# Patient Record
Sex: Male | Born: 1975 | Hispanic: No | Marital: Single | Smoking: Former smoker
Health system: Southern US, Community
[De-identification: ages and names within clinical notes are randomized; demographics above are authoritative.]

## PROBLEM LIST (undated history)

## (undated) DIAGNOSIS — R7303 Prediabetes: Secondary | ICD-10-CM

## (undated) DIAGNOSIS — I10 Essential (primary) hypertension: Secondary | ICD-10-CM

## (undated) HISTORY — PX: NO PAST SURGERIES: SHX2092

---

## 2014-06-01 ENCOUNTER — Ambulatory Visit (INDEPENDENT_AMBULATORY_CARE_PROVIDER_SITE_OTHER): Payer: 59 | Admitting: Family Medicine

## 2014-06-01 ENCOUNTER — Other Ambulatory Visit: Payer: Self-pay | Admitting: Family Medicine

## 2014-06-01 VITALS — BP 112/76 | HR 80 | Temp 99.5°F | Resp 16 | Ht 69.0 in | Wt 296.0 lb

## 2014-06-01 DIAGNOSIS — Z789 Other specified health status: Secondary | ICD-10-CM

## 2014-06-01 DIAGNOSIS — R7989 Other specified abnormal findings of blood chemistry: Secondary | ICD-10-CM

## 2014-06-01 DIAGNOSIS — E291 Testicular hypofunction: Secondary | ICD-10-CM | POA: Diagnosis not present

## 2014-06-01 DIAGNOSIS — G5602 Carpal tunnel syndrome, left upper limb: Secondary | ICD-10-CM | POA: Diagnosis not present

## 2014-06-01 DIAGNOSIS — G5601 Carpal tunnel syndrome, right upper limb: Secondary | ICD-10-CM

## 2014-06-01 DIAGNOSIS — G5603 Carpal tunnel syndrome, bilateral upper limbs: Secondary | ICD-10-CM

## 2014-06-01 DIAGNOSIS — Z3169 Encounter for other general counseling and advice on procreation: Secondary | ICD-10-CM

## 2014-06-01 NOTE — Patient Instructions (Addendum)
If all of your blood tests come back normal, I will refer you to a urologist so that you can have your sperm count checked.  If your blood tests come back abnormal, I will likely request that you come back in some morning for recheck. Recommend eating small frequent meals throughout the day, do not eat after 6 pm.  Dinner should be your smallest meal.  Try to choose low calorie snacks or things with high protein (yogurt, carrot and hummus). Make sure less thatn 25% of your meal is carbohydrate (breast, potato, corn, rice) and at least 50% is vegetables (potatoes and corn don't count)  Infertility WHAT IS INFERTILITY?  Infertility is usually defined as not being able to get pregnant after trying for one year of regular sexual intercourse without the use of contraceptives. Or not being able to carry a pregnancy to term and have a baby. The infertility rate in the [place] is around 10%. Pregnancy is the result of a chain of events. A woman must release an egg from one of her ovaries (ovulation). The egg must be fertilized by the male sperm. Then it travels through a fallopian tube into the uterus (womb), where it attaches to the wall of the uterus and grows. A man must have enough sperm, and the sperm must join with (fertilize) the egg along the way, at the proper time. The fertilized egg must then become attached to the inside of the uterus. While this may seem simple, many things can happen to prevent pregnancy from occurring.  WHOSE PROBLEM IS IT?  About 20% of infertility cases are due to problems with the man (male factors) and 65% are due to problems with the woman (male factors). Other cases are due to a combination of male and male factors or to unknown causes.  WHAT CAUSES INFERTILITY IN MEN?  Infertility in men is often caused by problems with making enough normal sperm or getting the sperm to reach the egg. Problems with sperm may exist from birth or develop later in life, due to illness  or injury. Some men produce no sperm, or produce too few sperm (oligospermia). Other problems include:  Sexual dysfunction.  Hormonal or endocrine problems.  Age. Male fertility decreases with age, but not at as young an age as male fertility.  Infection.  Congenital problems. Birth defect, such as absence of the tubes that carry the sperm (vas deferens).  Genetic/chromosomal problems.  Antisperm antibody problems.  Retrograde ejaculation (sperm go into the bladder).  Varicoceles, spematoceles, or tumors of the testicles.  Lifestyle can influence the number and quality of a man's sperm.  Alcohol and drugs can temporarily reduce sperm quality.  Environmental toxins, including pesticides and lead, may cause some cases of infertility in men. WHAT CAUSES INFERTILITY IN WOMEN?   Problems with ovulation account for most infertility in women. Without ovulation, eggs are not available to be fertilized.  Signs of problems with ovulation include irregular menstrual periods or no periods at all.  Simple lifestyle factors, including stress, diet, or athletic training, can affect a woman's hormonal balance.  Age. Fertility begins to decrease in women in the early 89s and is worse after age 27.  Much less often, a hormonal imbalance from a serious medical problem, such as a pituitary gland tumor, thyroid or other chronic medical disease, can cause ovulation problems.  Pelvic infections.  Polycystic ovary syndrome (increase in male hormones, unable to ovulate).  Alcohol or illegal drugs.  Environmental toxins, radiation, pesticides,  and certain chemicals.  Aging is an important factor in male infertility.  The ability of a woman's ovaries to produce eggs declines with age, especially after age 75. About one third of couples where the woman is over 9 will have problems with fertility.  By the time she reaches menopause when her monthly periods stop for good, a woman can no  longer produce eggs or become pregnant.  Other problems can also lead to infertility in women. If the fallopian tubes are blocked at one or both ends, the egg cannot travel through the tubes into the uterus. Scar tissue (adhesions) in the pelvis may cause blocked tubes. This may result from pelvic inflammatory disease, endometriosis, or surgery for an ectopic pregnancy (fertilized egg implanted outside the uterus) or any pelvic or abdominal surgery causing adhesions.  Fibroid tumors or polyps of the uterus.  Congenital (birth defect) abnormalities of the uterus.  Infection of the cervix (cervicitis).  Cervical stenosis (narrowing).  Abnormal cervical mucus.  Polycystic ovary syndrome.  Having sexual intercourse too often (every other day or 4 to 5 times a week).  Obesity.  Anorexia.  Poor nutrition.  Over exercising, with loss of body fat.  DES. Your mother received diethylstilbesterol hormone when pregnant with you. HOW IS INFERTILITY TESTED?  If you have been trying to have a baby without success, you may want to seek medical help. You should not wait for one year of trying before seeing a health care provider if:  You are over 35.  You have reason to believe that there may be a fertility problem. A medical evaluation may determine the reasons for a couple's infertility. Usually this process begins with:  Physical exams.  Medical histories of both partners.  Sexual histories of both partners. If there is no obvious problem, like improperly timed intercourse or absence of ovulation, tests may be needed.   For a man, testing usually begins with tests of his semen to look at:  The number of sperm.  The shape of sperm.  Movement of his sperm.  Taking a complete medical and surgical history.  Physical examination.  Check for infection of the male reproductive organs. Sometimes hormone tests are done.   For a woman, the first step in testing is to find out if she  is ovulating each month. There are several ways to do this. For example, she can keep track of changes in her morning body temperature and in the texture of her cervical mucus. Another tool is a home ovulation test kit, which can be bought at drug or grocery stores.  Checks of ovulation can also be done in the doctor's office, using blood tests for hormone levels or ultrasound tests of the ovaries. If the woman is ovulating, more tests will need to be done. Some common male tests include:  Hysterosalpingogram: An x-ray of the fallopian tubes and uterus after they are injected with dye. It shows if the tubes are open and shows the shape of the uterus.  Laparoscopy: An exam of the tubes and other male organs for disease. A lighted tube called a laparoscope is used to see inside the abdomen.  Endometrial biopsy: Sample of uterus tissue taken on the first day of the menstrual period, to see if the tissue indicates you are ovulating.  Transvaginal ultrasound: Examines the male organs.  Hysteroscopy: Uses a lighted tube to examine the cervix and inside the uterus, to see if there are any abnormalities inside the uterus. TREATMENT  Depending on  the test results, different treatments can be suggested. The type of treatment depends on the cause. 85 to 90% of infertility cases are treated with drugs or surgery.   Various fertility drugs may be used for women with ovulation problems. It is important to talk with your caregiver about the drug to be used. You should understand the drug's benefits and side effects. Depending on the type of fertility drug and the dosage of the drug used, multiple births (twins or multiples) can occur in some women.  If needed, surgery can be done to repair damage to a woman's ovaries, fallopian tubes, cervix, or uterus.  Surgery or medical treatment for endometriosis or polycystic ovary syndrome. Sometimes a man has an infertility problem that can be corrected with  medicine or by surgery.  Intrauterine insemination (IUI) of sperm, timed with ovulation.  Change in lifestyle, if that is the cause (lose weight, increase exercise, and stop smoking, drinking excessively, or taking illegal drugs).  Other types of surgery:  Removing growths inside and on the uterus.  Removing scar tissue from inside of the uterus.  Fixing blocked tubes.  Removing scar tissue in the pelvis and around the male organs. WHAT IS ASSISTED REPRODUCTIVE TECHNOLOGY (ART)?  Assisted reproductive technology (ART) is another form of special methods used to help infertile couples. ART involves handling both the woman's eggs and the man's sperm. Success rates vary and depend on many factors. ART can be expensive and time-consuming. But ART has made it possible for many couples to have children that otherwise would not have been conceived. Some methods are listed below:  In vitro fertilization (IVF). IVF is often used when a woman's fallopian tubes are blocked or when a man has low sperm counts. A drug is used to stimulate the ovaries to produce multiple eggs. Once mature, the eggs are removed and placed in a culture dish with the man's sperm for fertilization. After about 40 hours, the eggs are examined to see if they have become fertilized by the sperm and are dividing into cells. These fertilized eggs (embryos) are then placed in the woman's uterus. This bypasses the fallopian tubes.  Gamete intrafallopian transfer (GIFT) is similar to IVF, but used when the woman has at least one normal fallopian tube. Three to five eggs are placed in the fallopian tube, along with the man's sperm, for fertilization inside the woman's body.  Zygote intrafallopian transfer (ZIFT), also called tubal embryo transfer, combines IVF and GIFT. The eggs retrieved from the woman's ovaries are fertilized in the lab and placed in the fallopian tubes rather than in the uterus.  ART procedures sometimes involve the  use of donor eggs (eggs from another woman) or previously frozen embryos. Donor eggs may be used if a woman has impaired ovaries or has a genetic disease that could be passed on to her baby.  When performing ART, you are at higher risk for resulting in multiple pregnancies, twins, triplets or more.  Intracytoplasma sperm injection is a procedure that injects a single sperm into the egg to fertilize it.  Embryo transplant is a procedure that starts after growing an embryo in a special media (chemical solution) developed to keep the embryo alive for 2 to 5 days, and then transplanting it into the uterus. In cases where a cause cannot be found and pregnancy does not occur, adoption may be a consideration. Document Released: 12/29/2002 Document Revised: 03/20/2011 Document Reviewed: 11/24/2008 [facility] Patient Information 2015 [place], [place]. This information is not intended  to replace advice given to you by your health care provider. Make sure you discuss any questions you have with your health care provider.     Why follow it? Research shows. . Those who follow the Mediterranean diet have a reduced risk of heart disease  . The diet is associated with a reduced incidence of Parkinson's and Alzheimer's diseases . People following the diet may have longer life expectancies and lower rates of chronic diseases  . The Dietary Guidelines for Americans recommends the Mediterranean diet as an eating plan to promote health and prevent disease  What Is the Mediterranean Diet?  . Healthy eating plan based on typical foods and recipes of Mediterranean-style cooking . The diet is primarily a plant based diet; these foods should make up a majority of meals   Starches - Plant based foods should make up a majority of meals - They are an important sources of vitamins, minerals, energy, antioxidants, and fiber - Choose whole grains, foods high in fiber and minimally processed items  - Typical grain sources  include wheat, oats, barley, corn, brown rice, bulgar, farro, millet, polenta, couscous  - Various types of beans include chickpeas, lentils, fava beans, black beans, white beans   Fruits  Veggies - Large quantities of antioxidant rich fruits & veggies; 6 or more servings  - Vegetables can be eaten raw or lightly drizzled with oil and cooked  - Vegetables common to the traditional Mediterranean Diet include: artichokes, arugula, beets, broccoli, brussel sprouts, cabbage, carrots, celery, collard greens, cucumbers, eggplant, kale, leeks, lemons, lettuce, mushrooms, okra, onions, peas, peppers, potatoes, pumpkin, radishes, rutabaga, shallots, spinach, sweet potatoes, turnips, zucchini - Fruits common to the Mediterranean Diet include: apples, apricots, avocados, cherries, clementines, dates, figs, grapefruits, grapes, melons, nectarines, oranges, peaches, pears, pomegranates, strawberries, tangerines  Fats - Replace butter and margarine with healthy oils, such as olive oil, canola oil, and tahini  - Limit nuts to no more than a handful a day  - Nuts include walnuts, almonds, pecans, pistachios, pine nuts  - Limit or avoid candied, honey roasted or heavily salted nuts - Olives are central to the [employer] - can be eaten whole or used in a variety of dishes   Meats Protein - Limiting red meat: no more than a few times a month - When eating red meat: choose lean cuts and keep the portion to the size of deck of cards - Eggs: approx. 0 to 4 times a week  - Fish and lean poultry: at least 2 a week  - Healthy protein sources include, chicken, [place], lean beef, lamb - Increase intake of seafood such as tuna, salmon, trout, mackerel, shrimp, scallops - Avoid or limit high fat processed meats such as sausage and bacon  Dairy - Include moderate amounts of low fat dairy products  - Focus on healthy dairy such as fat free yogurt, skim milk, low or reduced fat cheese - Limit dairy products higher in  fat such as whole or 2% milk, cheese, ice cream  Alcohol - Moderate amounts of red wine is ok  - No more than 5 oz daily for women (all ages) and men older than age 61  - No more than 10 oz of wine daily for men younger than 74  Other - Limit sweets and other desserts  - Use herbs and spices instead of salt to flavor foods  - Herbs and spices common to the traditional Mediterranean Diet include: basil, bay leaves, chives, cloves, cumin, fennel,  garlic, lavender, marjoram, mint, oregano, parsley, pepper, rosemary, sage, savory, sumac, tarragon, thyme   It's not just a diet, it's a lifestyle:  . The Mediterranean diet includes lifestyle factors typical of those in the region  . Foods, drinks and meals are best eaten with others and savored . Daily physical activity is important for overall good health . This could be strenuous exercise like running and aerobics . This could also be more leisurely activities such as walking, housework, yard-work, or taking the stairs . Moderation is the key; a balanced and healthy diet accommodates most foods and drinks . Consider portion sizes and frequency of consumption of certain foods   Meal Ideas & Options:  . Breakfast:  o Whole wheat toast or whole wheat English muffins with peanut butter & hard boiled egg o Steel cut oats topped with apples & cinnamon and skim milk  o Fresh fruit: banana, strawberries, melon, berries, peaches  o Smoothies: strawberries, bananas, greek yogurt, peanut butter o Low fat greek yogurt with blueberries and granola  o Egg white omelet with spinach and mushrooms o Breakfast couscous: whole wheat couscous, apricots, skim milk, cranberries  . Sandwiches:  o Hummus and grilled vegetables (peppers, zucchini, squash) on whole wheat bread   o Grilled chicken on whole wheat pita with lettuce, tomatoes, cucumbers or tzatziki  o Tuna salad on whole wheat bread: tuna salad made with greek yogurt, olives, red peppers, capers, green  onions o Garlic rosemary lamb pita: lamb sauted with garlic, rosemary, salt & pepper; add lettuce, cucumber, greek yogurt to pita - flavor with lemon juice and black pepper  . Seafood:  o Mediterranean grilled salmon, seasoned with garlic, basil, parsley, lemon juice and black pepper o Shrimp, lemon, and spinach whole-grain pasta salad made with low fat greek yogurt  o Seared scallops with lemon orzo  o Seared tuna steaks seasoned salt, pepper, coriander topped with tomato mixture of olives, tomatoes, olive oil, minced garlic, parsley, green onions and cappers  . Meats:  o Herbed greek chicken salad with kalamata olives, cucumber, feta  o Red bell peppers stuffed with spinach, bulgur, lean ground beef (or lentils) & topped with feta   o Kebabs: skewers of chicken, tomatoes, onions, zucchini, squash  o [place] burgers: made with red onions, mint, dill, lemon juice, feta cheese topped with roasted red peppers . Vegetarian o Cucumber salad: cucumbers, artichoke hearts, celery, red onion, feta cheese, tossed in olive oil & lemon juice  o Hummus and whole grain pita points with a greek salad (lettuce, tomato, feta, olives, cucumbers, red onion) o Lentil soup with celery, carrots made with vegetable broth, garlic, salt and pepper  o Tabouli salad: parsley, bulgur, mint, scallions, cucumbers, tomato, radishes, lemon juice, olive oil, salt and pepper.

## 2014-06-01 NOTE — Progress Notes (Addendum)
Subjective:    Patient ID: Chad Valencia, male    DOB: 19-Oct-1975, 39 y.o.   MRN: [id number] This chart was scribed for Chad MochaEva N Malesha Suliman, MD by [place] Peace, ED Scribe. The patient was seen in [facility]. The patient's care was started at 5:40 PM.  Chief Complaint  Patient presents with  . Numbness    off and on x3 weeks rt hand and sometimes in lt leg   . testosterone levels     HPI  HPI Comments: Chad Valencia is a 39 y.o. male [facility] of intermittent numbness in fingers of both hands over the past few months that has gotten progressively worse. Pt reports that numbness is at it's worst in the mornings. He states he works with his hands a lot at work but denies any activities in particular that cause pain. No complaints of weakness in hands. He further denies any neck pain, shoulder pain, or wrist pain. Pt is right-handed.  He reports that he is not taking any medication for symptoms. He adds that he is not currently taking any medications or vitamins as well. Pt reports he has a PCP, last physical was 2 years ago.  [place] He has been married for 2 years and trying to conceive without any success. He states his wife left last week to go to back to [place] and he is not sure when she is coming back, possibly in a few months. He requests complete lab evaluation as to causes of male infertility. Our discussion is limited by language carrier so I initally thought he was asking for sperm count to be done but now suspect he was just asking for check of testosterone levels.   He wants to lose weight. He is going to the gym regularly and has been trying to consume fresh homemade smoothies, lemon water, and tea. He states he does not know why he is not losing weight but upon our in office discussion, he was very reluctant to make any recommended changes. He eats a large amount of bread at night.    History reviewed. No pertinent past medical history. History  reviewed. No pertinent past surgical history. No Known Allergies   Review of Systems  Constitutional: Positive for activity change, appetite change, fatigue and unexpected weight change. Negative for fever and chills.  Respiratory: Negative for chest tightness.   Cardiovascular: Negative for chest pain.  Gastrointestinal: Negative for vomiting and abdominal pain.  Endocrine: Negative for polyuria.  Genitourinary: Negative for frequency and enuresis.  Musculoskeletal: Positive for myalgias. Negative for back pain, joint swelling, arthralgias, gait problem, neck pain and neck stiffness.  Allergic/Immunologic: Negative for immunocompromised state.  Neurological: Positive for numbness (Right and Left fingers ). Negative for weakness.        Objective:   Physical Exam  Constitutional: He is oriented to person, place, and time. He appears well-developed and well-nourished. No distress.  HENT:  Head: Normocephalic and atraumatic.  Eyes: Conjunctivae and EOM are normal.  Neck: Neck supple. No tracheal deviation present.  Cardiovascular: Normal rate.   Pulmonary/Chest: Effort normal. No respiratory distress.  Musculoskeletal: Normal range of motion.  Good ROM of shoulders bilaterally.  Good ROM of Cervical spine.  4+/5 pincher grasp.   Neurological: He is alert and oriented to person, place, and time.  Reflex Scores:      Tricep reflexes are 2+ on the right side and 2+ on the left side.  Bicep reflexes are 2+ on the right side and 2+ on the left side.      Brachioradialis reflexes are 2+ on the right side and 2+ on the left side. Postive Phalen's on right. Negative reverse Phalen's. Negative Phalen's on the left.   Negative Spalding.  Positive Tinel's bilaterally.    Skin: Skin is warm and dry.  Psychiatric: He has a normal mood and affect. His behavior is normal.  Nursing note and vitals reviewed.   Filed Vitals:   06/01/14 1709  BP: 112/76  Pulse: 80  Temp: 99.5 F (37.5  C)  TempSrc: Oral  Resp: 16  Height:  (1.753 m)  Weight: 296 lb (134.265 kg)  SpO2: 98%    5:49 PM- Treatment plan was discussed with patient who verbalizes understanding and agrees.       Assessment & Plan:   1. Carpal tunnel syndrome, bilateral - start wrists splints bilaterally o/n and on Rt during day until sxs resolve - handout given - if do not or worsening rec referral to hand surg  2. Infertility counseling - difficult conversation due to language barrier - i suspect pt may want a sperm count to check for male infertility - wife is currently back in [place] and will return in a few months - they have unsuccessfully trying to conceive for >2  yrs so will refer to urology for further eval  3. Severe obesity (BMI >= 40) - reviewed diet changes - pt eating to many carbs (bread) and eating to infrequently so suggested these changes but pt seems to frustrated to grasp and try to make changes at this point - fasting x 8 hrs - so check labs.  4.       Low testosterone - test done today came back at 210 - repeat first thing a.m. Fasting (future lab orders entered so pt can do lab only visit for this) and refer to urology - pt is a good candidate for testosterone replacement as his BP and cholesterol are great and he may see more results from all of the exercise he is doing - the lack of which has been really frustrating for him -   Orders Placed This Encounter  Procedures  . Testosterone, Free, Total, SHBG  . Comprehensive metabolic panel    Order Specific Question:  Has the patient fasted?    Answer:  Yes  . TSH  . CBC  . Lipid panel    Order Specific Question:  Has the patient fasted?    Answer:  Yes     I personally performed the services described in this documentation, which was scribed in my presence. The recorded information has been reviewed and considered, and addended by me as needed.  Norberto Sorenson, MD MPH

## 2014-06-02 LAB — LIPID PANEL
CHOL/HDL RATIO: 3.3 ratio
Cholesterol: 210 mg/dL — ABNORMAL HIGH (ref 0–200)
HDL: 63 mg/dL (ref >=40)
LDL Cholesterol: 129 mg/dL — ABNORMAL HIGH (ref 0–99)
Triglycerides: 91 mg/dL (ref <150)
VLDL: 18 mg/dL (ref 0–40)

## 2014-06-02 LAB — COMPREHENSIVE METABOLIC PANEL
ALK PHOS: 52 U/L (ref 39–117)
ALT: 17 U/L (ref 0–53)
AST: 12 U/L (ref 0–37)
Albumin: 4.5 g/dL (ref 3.5–5.2)
BUN: 8 mg/dL (ref 6–23)
CALCIUM: 9.4 mg/dL (ref 8.4–10.5)
CO2: 26 meq/L (ref 19–32)
Chloride: 102 mEq/L (ref 96–112)
Creat: 0.54 mg/dL (ref 0.50–1.35)
Glucose, Bld: 106 mg/dL — ABNORMAL HIGH (ref 70–99)
POTASSIUM: 4.1 meq/L (ref 3.5–5.3)
SODIUM: 139 meq/L (ref 135–145)
Total Bilirubin: 0.4 mg/dL (ref 0.2–1.2)
Total Protein: 7 g/dL (ref 6.0–8.3)

## 2014-06-02 LAB — CBC
HCT: 41.9 % (ref 39.0–52.0)
Hemoglobin: 14.8 g/dL (ref 13.0–17.0)
MCH: 29.2 pg (ref 26.0–34.0)
MCHC: 35.3 g/dL (ref 30.0–36.0)
MCV: 82.8 fL (ref 78.0–100.0)
MPV: 9.4 fL (ref 8.6–12.4)
PLATELETS: 374 10*3/uL (ref 150–400)
RBC: 5.06 MIL/uL (ref 4.22–5.81)
RDW: 13.2 % (ref 11.5–15.5)
WBC: 5 10*3/uL (ref 4.0–10.5)

## 2014-06-02 LAB — TSH: TSH: 0.908 u[IU]/mL (ref 0.350–4.500)

## 2014-06-03 ENCOUNTER — Encounter: Payer: Self-pay | Admitting: Family Medicine

## 2014-06-03 LAB — TESTOSTERONE, FREE, TOTAL, SHBG
Sex Hormone Binding: 34 nmol/L (ref 10–50)
TESTOSTERONE FREE: 39.3 pg/mL — AB (ref 47.0–244.0)
TESTOSTERONE: 210 ng/dL — AB (ref 300–890)
Testosterone-% Free: 1.9 % (ref 1.6–2.9)

## 2014-06-03 LAB — HEMOGLOBIN A1C
HEMOGLOBIN A1C: 5.9 % — AB (ref <5.7)
Mean Plasma Glucose: 123 mg/dL — ABNORMAL HIGH (ref <117)

## 2014-06-03 NOTE — Addendum Note (Signed)
Addended by: Norberto SorensonSHAW, EVA on: 06/03/2014 09:51 AM   Modules accepted: Orders

## 2014-06-07 ENCOUNTER — Encounter: Payer: Self-pay | Admitting: Family Medicine

## 2015-02-26 ENCOUNTER — Ambulatory Visit (INDEPENDENT_AMBULATORY_CARE_PROVIDER_SITE_OTHER): Payer: 59 | Admitting: Urgent Care

## 2015-02-26 ENCOUNTER — Ambulatory Visit (INDEPENDENT_AMBULATORY_CARE_PROVIDER_SITE_OTHER): Payer: 59

## 2015-02-26 VITALS — BP 118/76 | HR 81 | Temp 99.5°F | Resp 16 | Ht 68.75 in | Wt 298.0 lb

## 2015-02-26 DIAGNOSIS — R109 Unspecified abdominal pain: Secondary | ICD-10-CM

## 2015-02-26 DIAGNOSIS — R112 Nausea with vomiting, unspecified: Secondary | ICD-10-CM

## 2015-02-26 DIAGNOSIS — R195 Other fecal abnormalities: Secondary | ICD-10-CM

## 2015-02-26 DIAGNOSIS — R630 Anorexia: Secondary | ICD-10-CM

## 2015-02-26 DIAGNOSIS — R1084 Generalized abdominal pain: Secondary | ICD-10-CM | POA: Diagnosis not present

## 2015-02-26 LAB — POCT CBC
Granulocyte percent: 62.5 %G (ref 37–80)
HCT, POC: 41.3 % — AB (ref 43.5–53.7)
Hemoglobin: 14.5 g/dL (ref 14.1–18.1)
Lymph, poc: 2.5 (ref 0.6–3.4)
MCH, POC: 29.8 pg (ref 27–31.2)
MCHC: 35.2 g/dL (ref 31.8–35.4)
MCV: 84.5 fL (ref 80–97)
MID (cbc): 0.3 (ref 0–0.9)
MPV: 6.6 fL (ref 0–99.8)
PLATELET COUNT, POC: 362 10*3/uL (ref 142–424)
POC Granulocyte: 4.7 (ref 2–6.9)
POC LYMPH PERCENT: 32.9 %L (ref 10–50)
POC MID %: 4.6 % (ref 0–12)
RBC: 4.88 M/uL (ref 4.69–6.13)
RDW, POC: 12.6 %
WBC: 7.5 10*3/uL (ref 4.6–10.2)

## 2015-02-26 LAB — COMPREHENSIVE METABOLIC PANEL
ALBUMIN: 4.3 g/dL (ref 3.6–5.1)
ALK PHOS: 58 U/L (ref 40–115)
ALT: 15 U/L (ref 9–46)
AST: 11 U/L (ref 10–40)
BUN: 6 mg/dL — ABNORMAL LOW (ref 7–25)
CHLORIDE: 102 mmol/L (ref 98–110)
CO2: 29 mmol/L (ref 20–31)
CREATININE: 0.53 mg/dL — AB (ref 0.60–1.35)
Calcium: 9.5 mg/dL (ref 8.6–10.3)
Glucose, Bld: 107 mg/dL — ABNORMAL HIGH (ref 65–99)
POTASSIUM: 4.6 mmol/L (ref 3.5–5.3)
Sodium: 140 mmol/L (ref 135–146)
TOTAL PROTEIN: 6.9 g/dL (ref 6.1–8.1)
Total Bilirubin: 0.5 mg/dL (ref 0.2–1.2)

## 2015-02-26 LAB — POCT GLYCOSYLATED HEMOGLOBIN (HGB A1C): Hemoglobin A1C: 5.6

## 2015-02-26 MED ORDER — ONDANSETRON 4 MG PO TBDP
8.0000 mg | ORAL_TABLET | Freq: Once | ORAL | Status: AC
  Administered 2015-02-26: 8 mg via ORAL

## 2015-02-26 MED ORDER — ONDANSETRON 8 MG PO TBDP: 8.0000 mg | ORAL_TABLET | Freq: Three times a day (TID) | ORAL | Status: DC | PRN

## 2015-02-26 NOTE — Addendum Note (Signed)
Addended by: Marcellus Scott on: 02/26/2015 06:43 PM   Modules accepted: Orders

## 2015-02-26 NOTE — Progress Notes (Signed)
MRN: [id number] DOB: [birth year]  Subjective:   Chad Valencia is a 40 y.o. male presenting for chief complaint of Abdominal Pain  Reports ~3 day history of upper belly pain, nausea and vomiting x3 (yesterday), loose stools (3 BM yesterday), decreased appetite, chills. Patient has not been able to hold anything down. Has not tried any medications to help his symptoms. He has also had intermittent chest pain/epigastric pain. Chest pain is mid-sternal, achy, non-radiating, worse when he tries to eat. Denies fever, bloody stools, hematemesis. Patient smokes ~5 cigarettes, smoking for ~15 years. Denies alcohol use.  Chad Valencia currently has no medications in their medication list. Also has No Known Allergies.  Chad Valencia  has no past medical history on file. Also  has no past surgical history on file.  Objective:   Vitals: BP 118/76 mmHg  Pulse 81  Temp(Src) 99.5 F (37.5 C) (Oral)  Resp 16  Ht 5' 8.75" (1.746 m)  Wt 298 lb (135.172 kg)  BMI 44.34 kg/m2  SpO2 96%  Physical Exam  Constitutional: He is oriented to person, place, and time. He appears well-developed and well-nourished.  Body habitus is morbidly obese.  HENT:  Mouth/Throat: Oropharynx is clear and moist.  Eyes: Right eye exhibits no discharge. Left eye exhibits no discharge. No scleral icterus.  Neck: Normal range of motion. Neck supple.  Cardiovascular: Normal rate, regular rhythm and intact distal pulses.  Exam reveals no gallop and no friction rub.   No murmur heard. Pulmonary/Chest: No respiratory distress. He has no wheezes. He has no rales.  Abdominal: Soft. Bowel sounds are normal. He exhibits no distension and no mass. There is tenderness (right-sided, worst in RLQ).  Neurological: He is alert and oriented to person, place, and time.  Skin: Skin is warm and dry.   Results for orders placed or performed in visit on 02/26/15 (from the past 24 hour(s))  POCT CBC     Status: Abnormal   Collection Time: 02/26/15   4:57 PM  Result Value Ref Range   WBC 7.5 4.6 - 10.2 K/uL   Lymph, poc 2.5 0.6 - 3.4   POC LYMPH PERCENT 32.9 10 - 50 %L   MID (cbc) 0.3 0 - 0.9   POC MID % 4.6 0 - 12 %M   POC Granulocyte 4.7 2 - 6.9   Granulocyte percent 62.5 37 - 80 %G   RBC 4.88 4.69 - 6.13 M/uL   Hemoglobin 14.5 14.1 - 18.1 g/dL   HCT, POC 29.5 (A) 62.1 - 53.7 %   MCV 84.5 80 - 97 fL   MCH, POC 29.8 27 - 31.2 pg   MCHC 35.2 31.8 - 35.4 g/dL   RDW, POC 30.8 %   Platelet Count, POC 362 142 - 424 K/uL   MPV 6.6 0 - 99.8 fL  POCT glycosylated hemoglobin (Hb A1C)     Status: None   Collection Time: 02/26/15  5:03 PM  Result Value Ref Range   Hemoglobin A1C 5.6    Dg Abd 1 View  02/26/2015  CLINICAL DATA:  Right abdominal pain, nausea and vomiting. EXAM: ABDOMEN - 1 VIEW COMPARISON:  None. FINDINGS: No dilated small bowel loops. Minimal physiologic stool in the colon. No evidence of pneumatosis, pneumoperitoneum or pathologic soft tissue calcifications. Visualized osseous structures appear intact. IMPRESSION: Nonobstructive bowel gas pattern. Electronically Signed   By: Delbert Phenix M.D.   On: 02/26/2015 17:06   Assessment and Plan :   1. Generalized abdominal pain 2. Nausea and  vomiting, intractability of vomiting not specified, unspecified vomiting type 3. Loose stools 4. Decreased appetite 5. Right sided abdominal pain - Patient was unable to provide urine sample which is suspect is due to dehydration. He had significant improvement with 1L of fluids and  Zofran. Patient would like to manage this as viral gastroenteritis, rtc if no improvement in 5 days.  Chad Bamberg, PA-C [facility] [phone] 02/26/2015 4:29 PM

## 2015-02-26 NOTE — Patient Instructions (Addendum)
Because you received an x-ray today, you will receive an invoice from [facility]. Please contact [facility] at [phone] with questions or concerns regarding your invoice. Our billing staff will not be able to assist you with those questions.    Viral Gastroenteritis Viral gastroenteritis is also known as stomach flu. This condition affects the stomach and intestinal tract. It can cause sudden diarrhea and vomiting. The illness typically lasts 3 to 8 days. Most people develop an immune response that eventually gets rid of the virus. While this natural response develops, the virus can make you quite ill. CAUSES  Many different viruses can cause gastroenteritis, such as rotavirus or noroviruses. You can catch one of these viruses by consuming contaminated food or water. You may also catch a virus by sharing utensils or other personal items with an infected person or by touching a contaminated surface. SYMPTOMS  The most common symptoms are diarrhea and vomiting. These problems can cause a severe loss of body fluids (dehydration) and a body salt (electrolyte) imbalance. Other symptoms may include:  Fever.  Headache.  Fatigue.  Abdominal pain. DIAGNOSIS  Your caregiver can usually diagnose viral gastroenteritis based on your symptoms and a physical exam. A stool sample may also be taken to test for the presence of viruses or other infections. TREATMENT  This illness typically goes away on its own. Treatments are aimed at rehydration. The most serious cases of viral gastroenteritis involve vomiting so severely that you are not able to keep fluids down. In these cases, fluids must be given through an intravenous line (IV). HOME CARE INSTRUCTIONS   Drink enough fluids to keep your urine clear or pale yellow. Drink small amounts of fluids frequently and increase the amounts as tolerated.  Ask your caregiver for specific rehydration instructions.  Avoid:  Foods high in  sugar.  Alcohol.  Carbonated drinks.  Tobacco.  Juice.  Caffeine drinks.  Extremely hot or cold fluids.  Fatty, greasy foods.  Too much intake of anything at one time.  Dairy products until 24 to 48 hours after diarrhea stops.  You may consume probiotics. Probiotics are active cultures of beneficial bacteria. They may lessen the amount and number of diarrheal stools in adults. Probiotics can be found in yogurt with active cultures and in supplements.  Wash your hands well to avoid spreading the virus.  Only take over-the-counter or prescription medicines for pain, discomfort, or fever as directed by your caregiver. Do not give aspirin to children. Antidiarrheal medicines are not recommended.  Ask your caregiver if you should continue to take your regular prescribed and over-the-counter medicines.  Keep all follow-up appointments as directed by your caregiver. SEEK IMMEDIATE MEDICAL CARE IF:   You are unable to keep fluids down.  You do not urinate at least once every 6 to 8 hours.  You develop shortness of breath.  You notice blood in your stool or vomit. This may look like coffee grounds.  You have abdominal pain that increases or is concentrated in one small area (localized).  You have persistent vomiting or diarrhea.  You have a fever.  The patient is a child younger than 3 months, and he or she has a fever.  The patient is a child older than 3 months, and he or she has a fever and persistent symptoms.  The patient is a child older than 3 months, and he or she has a fever and symptoms suddenly get worse.  The patient is a baby, and he or  she has no tears when crying. MAKE SURE YOU:   Understand these instructions.  Will watch your condition.  Will get help right away if you are not doing well or get worse.   This information is not intended to replace advice given to you by your health care provider. Make sure you discuss any questions you have with your  health care provider.   Document Released: 12/26/2004 Document Revised: 03/20/2011 Document Reviewed: 10/12/2010 Elsevier Interactive Patient Education [employer]

## 2015-03-01 ENCOUNTER — Encounter: Payer: Self-pay | Admitting: Urgent Care

## 2017-02-02 IMAGING — CR DG ABDOMEN 1V
2 series · 2 of 2 positions shown · non-contrast
Comparison: None.

CLINICAL DATA: Right abdominal pain, nausea and vomiting.

EXAM:
ABDOMEN - 1 VIEW

[AP (1 of 2)]
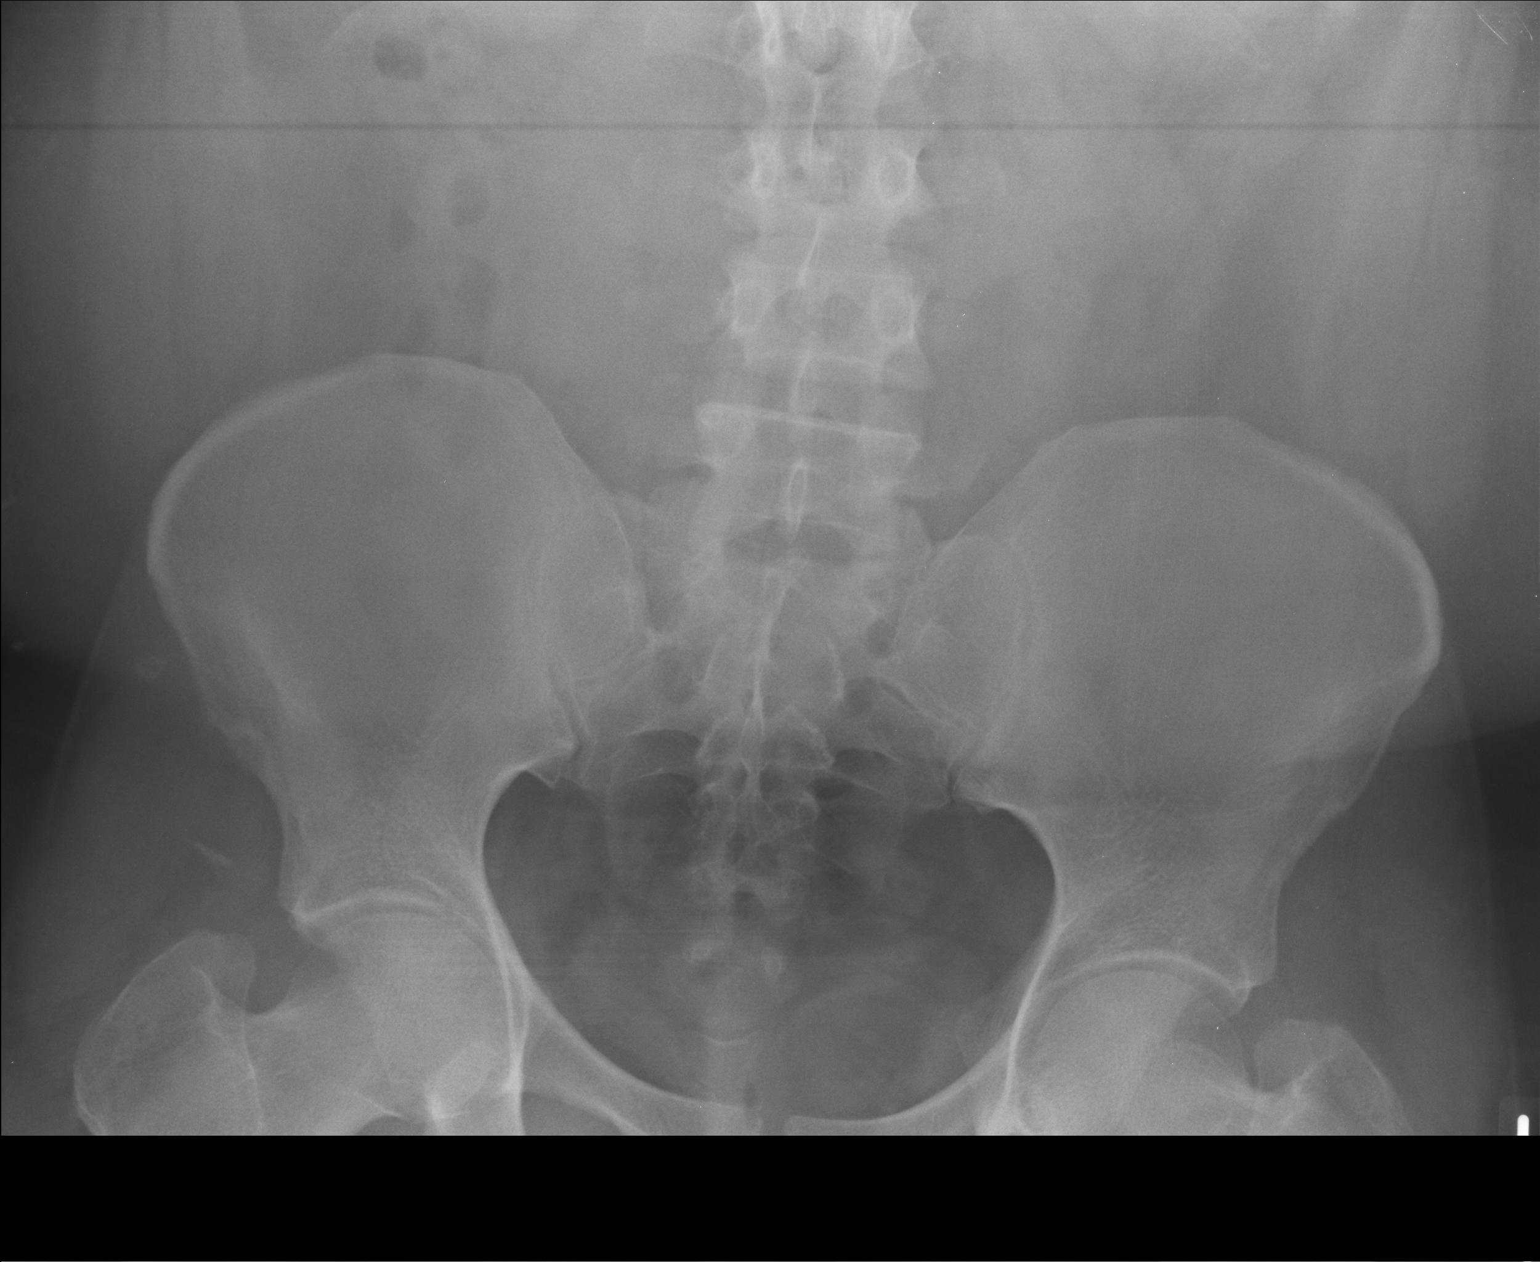

[AP (2 of 2)]
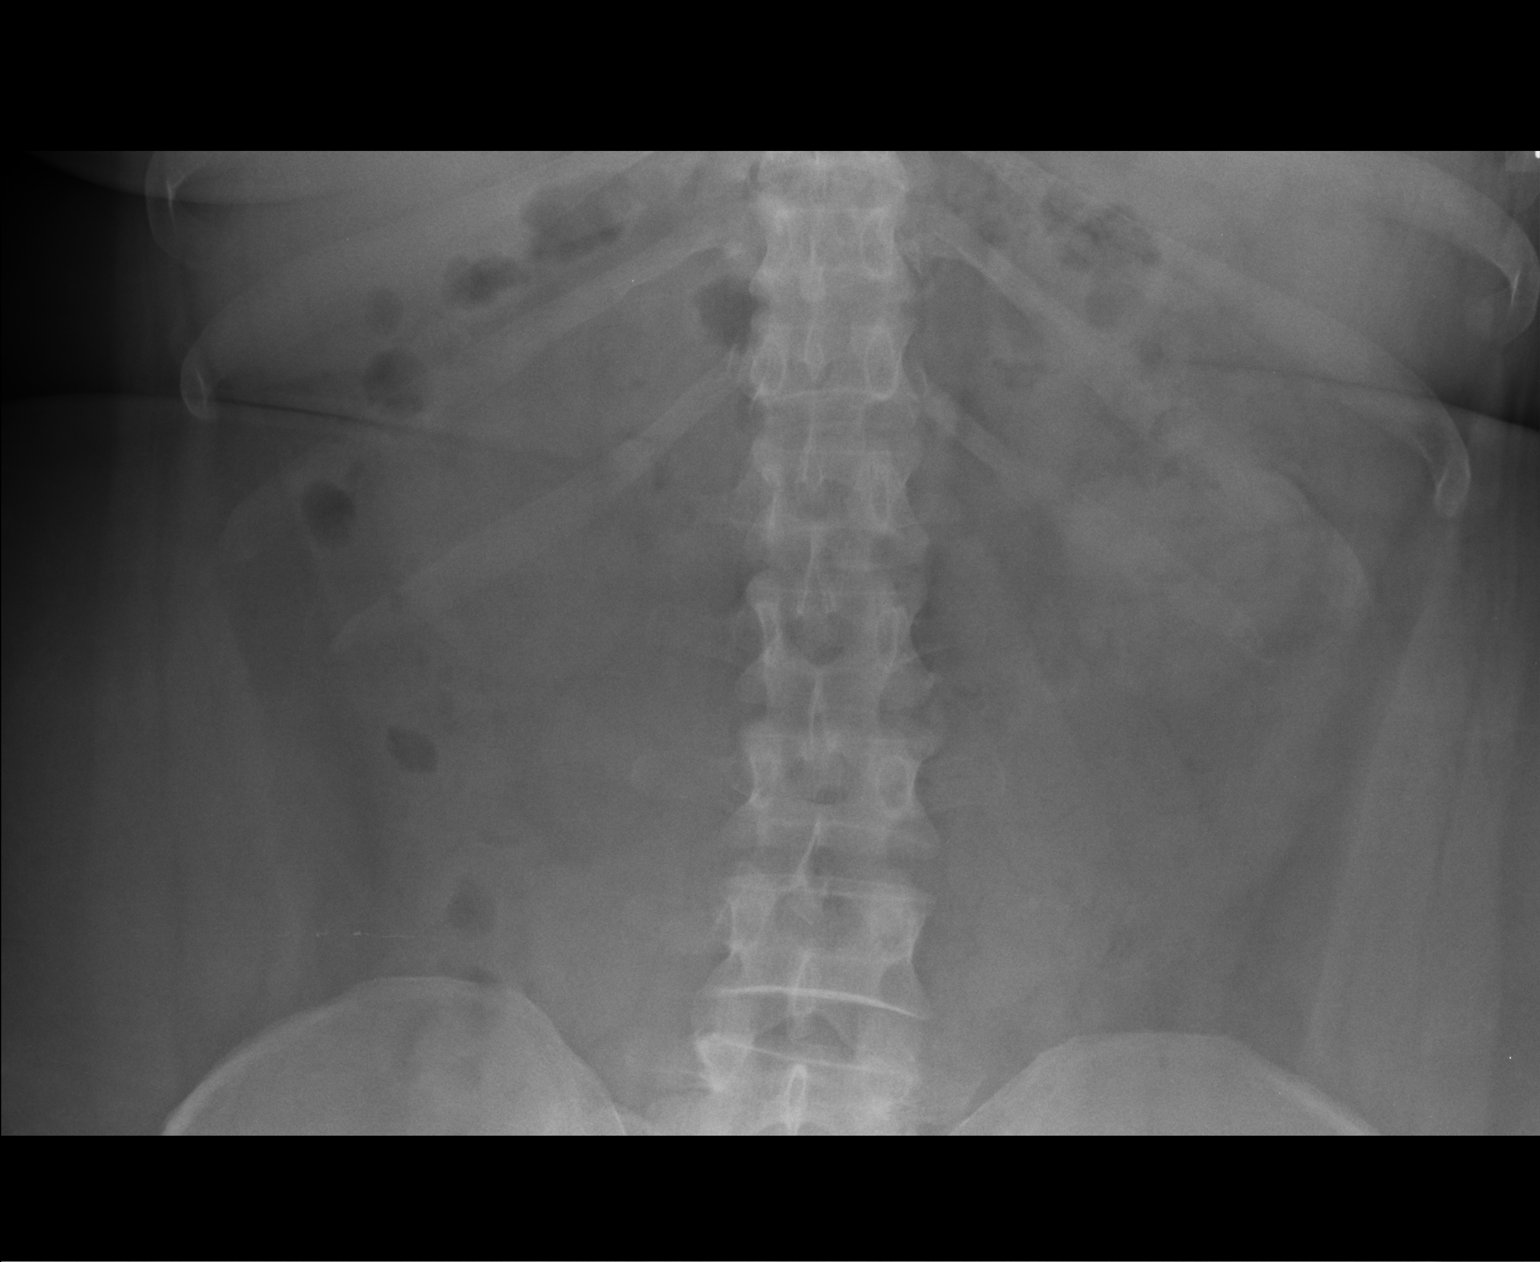

[2 of 2 positions shown; findings below may reference images not displayed]

FINDINGS: No dilated small bowel loops. Minimal physiologic stool in the
colon. No evidence of pneumatosis, pneumoperitoneum or pathologic
soft tissue calcifications. Visualized osseous structures appear
intact.
IMPRESSION: Nonobstructive bowel gas pattern.

## 2019-01-22 ENCOUNTER — Ambulatory Visit: Payer: 59 | Attending: Internal Medicine

## 2019-01-22 DIAGNOSIS — Z20822 Contact with and (suspected) exposure to covid-19: Secondary | ICD-10-CM

## 2019-01-23 LAB — NOVEL CORONAVIRUS, NAA: SARS-CoV-2, NAA: NOT DETECTED

## 2019-05-12 ENCOUNTER — Ambulatory Visit: Payer: Self-pay | Attending: Internal Medicine

## 2020-04-26 ENCOUNTER — Ambulatory Visit: Payer: Self-pay | Admitting: General Surgery

## 2020-04-30 NOTE — Patient Instructions (Addendum)
DUE TO COVID-19 ONLY ONE VISITOR IS ALLOWED TO COME WITH YOU AND STAY IN THE WAITING ROOM ONLY DURING PRE OP AND PROCEDURE DAY OF SURGERY. THE 1 VISITOR  MAY VISIT WITH YOU AFTER SURGERY IN YOUR PRIVATE ROOM DURING VISITING HOURS ONLY!  YOU NEED TO HAVE A COVID 19 TEST ON: 05/10/20 @ 2:50 PM , THIS TEST MUST BE DONE BEFORE SURGERY,  COVID TESTING SITE [address] [place] [phone], IT IS ON THE RIGHT GOING OUT [address] APPROXIMATELY  2 MINUTES PAST [employer] ON THE RIGHT. ONCE YOUR COVID TEST IS COMPLETED,  PLEASE BEGIN THE QUARANTINE INSTRUCTIONS AS OUTLINED IN YOUR HANDOUT.                Chad Valencia    Your procedure is scheduled on: 05/13/20   Report to [facility] Main  Entrance   Report to admitting at: 1:30 PM     Call this number if you have problems the morning of surgery [phone]    Remember: Do not eat solid food :After Midnight. Clear liquids until: 12:30 PM.  CLEAR LIQUID DIET  Foods Allowed                                                                     Foods Excluded  Coffee and tea, regular and decaf                             liquids that you cannot  Plain Jell-O any favor except red or purple                                           see through such as: Fruit ices (not with fruit pulp)                                     milk, soups, orange juice  Iced Popsicles                                    All solid food Carbonated beverages, regular and diet                                    Cranberry, grape and apple juices Sports drinks like Gatorade Lightly seasoned clear broth or consume(fat free) Sugar, honey syrup  Sample Menu Breakfast                                Lunch                                     Supper Cranberry juice  Beef broth                            Chicken broth Jell-O                                     Grape juice                           Apple juice Coffee or tea                         Jell-O                                      Popsicle                                                Coffee or tea                        Coffee or tea  _____________________________________________________________________  BRUSH YOUR TEETH MORNING OF SURGERY AND RINSE YOUR MOUTH OUT, NO CHEWING GUM CANDY OR MINTS.                              You may not have any metal on your body including hair pins and              piercings  Do not wear jewelry, lotions, powders or perfumes, deodorant             Men may shave face and neck.   Do not bring valuables to the hospital. [facility] IS NOT             RESPONSIBLE   FOR VALUABLES.  Contacts, dentures or bridgework may not be worn into surgery.  Leave suitcase in the car. After surgery it may be brought to your room.     Patients discharged the day of surgery will not be allowed to drive home. IF YOU ARE HAVING SURGERY AND GOING HOME THE SAME DAY, YOU MUST HAVE AN ADULT TO DRIVE YOU HOME AND BE WITH YOU FOR 24 HOURS. YOU MAY GO HOME BY TAXI OR UBER OR ORTHERWISE, BUT AN ADULT MUST ACCOMPANY YOU HOME AND STAY WITH YOU FOR 24 HOURS.  Name and phone number of your driver:  Special Instructions: N/A              Please read over the following fact sheets you were given: _____________________________________________________________________   How to Manage Your Diabetes Before and After Surgery  Why is it important to control my blood sugar before and after surgery? . Improving blood sugar levels before and after surgery helps healing and can limit problems. . A way of improving blood sugar control is eating a healthy diet by: o  Eating less sugar and carbohydrates o  Increasing activity/exercise o  Talking with your doctor about reaching your blood sugar goals . High blood sugars (greater than 180 mg/dL) can raise your risk of infections and slow your recovery, so  you will need to focus on controlling your diabetes  during the weeks before surgery. . Make sure that the doctor who takes care of your diabetes knows about your planned surgery including the date and location.  How do I manage my blood sugar before surgery? . Check your blood sugar at least 4 times a day, starting 2 days before surgery, to make sure that the level is not too high or low. o Check your blood sugar the morning of your surgery when you wake up and every 2 hours until you get to the Short Stay unit. . If your blood sugar is less than 70 mg/dL, you will need to treat for low blood sugar: o Do not take insulin. o Treat a low blood sugar (less than 70 mg/dL) with  cup of clear juice (cranberry or apple), 4 glucose tablets, OR glucose gel. o Recheck blood sugar in 15 minutes after treatment (to make sure it is greater than 70 mg/dL). If your blood sugar is not greater than 70 mg/dL on recheck, call [phone] for further instructions. . Report your blood sugar to the short stay nurse when you get to Short Stay.  . If you are admitted to the hospital after surgery: o Your blood sugar will be checked by the staff and you will probably be given insulin after surgery (instead of oral diabetes medicines) to make sure you have good blood sugar levels. o The goal for blood sugar control after surgery is 80-180 mg/dL.   WHAT DO I DO ABOUT MY DIABETES MEDICATION?  Marland Kitchen Do not take oral diabetes medicines (pills) the morning of surgery.  . THE  DAY BEFORE SURGERY, take Metformin as usual.  . THE MORNING OF SURGERY, DO NOT TAKE any diabetics medicine.       [place] - Preparing for Surgery Before surgery, you can play an important role.  Because skin is not sterile, your skin needs to be as free of germs as possible.  You can reduce the number of germs on your skin by washing with CHG (chlorahexidine gluconate) soap before surgery.  CHG is an antiseptic cleaner which kills germs and bonds with the skin to continue killing germs even after  washing. Please DO NOT use if you have an allergy to CHG or antibacterial soaps.  If your skin becomes reddened/irritated stop using the CHG and inform your nurse when you arrive at Short Stay. Do not shave (including legs and underarms) for at least 48 hours prior to the first CHG shower.  You may shave your face/neck. Please follow these instructions carefully:  1.  Shower with CHG Soap the night before surgery and the  morning of Surgery.  2.  If you choose to wash your hair, wash your hair first as usual with your  normal  shampoo.  3.  After you shampoo, rinse your hair and body thoroughly to remove the  shampoo.                           4.  Use CHG as you would any other liquid soap.  You can apply chg directly  to the skin and wash                       Gently with a scrungie or clean washcloth.  5.  Apply the CHG Soap to your body ONLY FROM THE NECK DOWN.   Do not use on  face/ open                           Wound or open sores. Avoid contact with eyes, ears mouth and genitals (private parts).                       Wash face,  Genitals (private parts) with your normal soap.             6.  Wash thoroughly, paying special attention to the area where your surgery  will be performed.  7.  Thoroughly rinse your body with warm water from the neck down.  8.  DO NOT shower/wash with your normal soap after using and rinsing off  the CHG Soap.                9.  Pat yourself dry with a clean towel.            10.  Wear clean pajamas.            11.  Place clean sheets on your bed the night of your first shower and do not  sleep with pets. Day of Surgery : Do not apply any lotions/deodorants the morning of surgery.  Please wear clean clothes to the hospital/surgery center.  FAILURE TO FOLLOW THESE INSTRUCTIONS MAY RESULT IN THE CANCELLATION OF YOUR SURGERY PATIENT SIGNATURE_________________________________  NURSE  SIGNATURE__________________________________  ________________________________________________________________________

## 2020-05-03 ENCOUNTER — Encounter (HOSPITAL_COMMUNITY)
Admission: RE | Admit: 2020-05-03 | Discharge: 2020-05-03 | Disposition: A | Discharge status: Home / Self Care | Payer: 59 | Source: Ambulatory Visit | Attending: General Surgery | Admitting: General Surgery

## 2020-05-03 ENCOUNTER — Encounter (HOSPITAL_COMMUNITY): Payer: Self-pay

## 2020-05-03 ENCOUNTER — Other Ambulatory Visit: Payer: Self-pay

## 2020-05-03 DIAGNOSIS — Z01818 Encounter for other preprocedural examination: Secondary | ICD-10-CM | POA: Diagnosis present

## 2020-05-03 HISTORY — DX: Essential (primary) hypertension: I10

## 2020-05-03 HISTORY — DX: Prediabetes: R73.03

## 2020-05-03 LAB — BASIC METABOLIC PANEL
Anion gap: 10 (ref 5–15)
BUN: 10 mg/dL (ref 6–20)
CO2: 23 mmol/L (ref 22–32)
Calcium: 9.3 mg/dL (ref 8.9–10.3)
Chloride: 104 mmol/L (ref 98–111)
Creatinine, Ser: 0.52 mg/dL — ABNORMAL LOW (ref 0.61–1.24)
GFR, Estimated: >60 mL/min (ref >60)
Glucose, Bld: 127 mg/dL — ABNORMAL HIGH (ref 70–99)
Potassium: 3.7 mmol/L (ref 3.5–5.1)
Sodium: 137 mmol/L (ref 135–145)

## 2020-05-03 LAB — CBC
HCT: 41.1 % (ref 39.0–52.0)
Hemoglobin: 14 g/dL (ref 13.0–17.0)
MCH: 28.6 pg (ref 26.0–34.0)
MCHC: 34.1 g/dL (ref 30.0–36.0)
MCV: 83.9 fL (ref 80.0–100.0)
Platelets: 369 10*3/uL (ref 150–400)
RBC: 4.9 MIL/uL (ref 4.22–5.81)
RDW: 12.2 % (ref 11.5–15.5)
WBC: 6.2 10*3/uL (ref 4.0–10.5)
nRBC: 0 % (ref 0.0–0.2)

## 2020-05-03 LAB — HEMOGLOBIN A1C
Hgb A1c MFr Bld: 6.8 % — ABNORMAL HIGH (ref 4.8–5.6)
Mean Plasma Glucose: 148.46 mg/dL

## 2020-05-03 NOTE — Progress Notes (Addendum)
COVID Vaccine Completed: Yes Date COVID Vaccine completed: 11/2019 COVID vaccine manufacturer: Pfizer   PCP - [facility] [place] Cardiologist -   Chest x-ray -  EKG -  Stress Test -  ECHO -  Cardiac Cath -  Pacemaker/ICD device last checked:  Sleep Study -  CPAP -   Fasting Blood Sugar -  Checks Blood Sugar _____ times a day  Blood Thinner Instructions: Aspirin Instructions: Last Dose:  Anesthesia review: Hx: Pre-Dia.,HTN  Patient denies shortness of breath, fever, cough and chest pain at PAT appointment   Patient verbalized understanding of instructions that were given to them at the PAT appointment. Patient was also instructed that they will need to review over the PAT instructions again at home before surgery.

## 2020-05-10 ENCOUNTER — Other Ambulatory Visit (HOSPITAL_COMMUNITY)
Admission: RE | Admit: 2020-05-10 | Discharge: 2020-05-10 | Disposition: A | Discharge status: Home / Self Care | Payer: 59 | Source: Ambulatory Visit | Attending: General Surgery | Admitting: General Surgery

## 2020-05-10 DIAGNOSIS — Z01812 Encounter for preprocedural laboratory examination: Secondary | ICD-10-CM | POA: Insufficient documentation

## 2020-05-10 DIAGNOSIS — Z20822 Contact with and (suspected) exposure to covid-19: Secondary | ICD-10-CM | POA: Diagnosis not present

## 2020-05-11 LAB — SARS CORONAVIRUS 2 (TAT 6-24 HRS): SARS Coronavirus 2: NEGATIVE

## 2020-05-12 MED ORDER — BUPIVACAINE LIPOSOME 1.3 % IJ SUSP
20.0000 mL | Freq: Once | INTRAMUSCULAR | Status: DC
  Filled 2020-05-12: qty 20

## 2020-05-13 ENCOUNTER — Ambulatory Visit (HOSPITAL_COMMUNITY): Payer: 59 | Admitting: Anesthesiology

## 2020-05-13 ENCOUNTER — Ambulatory Visit (HOSPITAL_COMMUNITY)
Admission: RE | Admit: 2020-05-13 | Discharge: 2020-05-13 | Disposition: A | Discharge status: Home / Self Care | Payer: 59 | Attending: General Surgery | Admitting: General Surgery

## 2020-05-13 ENCOUNTER — Encounter (HOSPITAL_COMMUNITY)
Admission: RE | Disposition: A | Discharge status: Home / Self Care | Payer: Self-pay | Source: Home / Self Care | Attending: General Surgery

## 2020-05-13 ENCOUNTER — Encounter (HOSPITAL_COMMUNITY): Payer: Self-pay | Admitting: General Surgery

## 2020-05-13 DIAGNOSIS — Z7984 Long term (current) use of oral hypoglycemic drugs: Secondary | ICD-10-CM | POA: Diagnosis not present

## 2020-05-13 DIAGNOSIS — Z87891 Personal history of nicotine dependence: Secondary | ICD-10-CM | POA: Insufficient documentation

## 2020-05-13 DIAGNOSIS — R7303 Prediabetes: Secondary | ICD-10-CM | POA: Insufficient documentation

## 2020-05-13 DIAGNOSIS — I10 Essential (primary) hypertension: Secondary | ICD-10-CM | POA: Insufficient documentation

## 2020-05-13 DIAGNOSIS — Z79899 Other long term (current) drug therapy: Secondary | ICD-10-CM | POA: Diagnosis not present

## 2020-05-13 DIAGNOSIS — K429 Umbilical hernia without obstruction or gangrene: Secondary | ICD-10-CM | POA: Diagnosis present

## 2020-05-13 HISTORY — PX: UMBILICAL HERNIA REPAIR: SHX196

## 2020-05-13 LAB — GLUCOSE, CAPILLARY: Glucose-Capillary: 154 mg/dL — ABNORMAL HIGH (ref 70–99)

## 2020-05-13 SURGERY — REPAIR, HERNIA, UMBILICAL, LAPAROSCOPIC
Anesthesia: General

## 2020-05-13 MED ORDER — BUPIVACAINE-EPINEPHRINE 0.25% -1:200000 IJ SOLN
INTRAMUSCULAR | Status: DC | PRN
  Administered 2020-05-13: 30 mL

## 2020-05-13 MED ORDER — HYDROMORPHONE HCL 1 MG/ML IJ SOLN
INTRAMUSCULAR | Status: AC
  Filled 2020-05-13: qty 1

## 2020-05-13 MED ORDER — ORAL CARE MOUTH RINSE: 15.0000 mL | Freq: Once | OROMUCOSAL | Status: AC

## 2020-05-13 MED ORDER — FENTANYL CITRATE (PF) 100 MCG/2ML IJ SOLN
INTRAMUSCULAR | Status: DC | PRN
  Administered 2020-05-13: 50 ug via INTRAVENOUS
  Administered 2020-05-13 (×2): 100 ug via INTRAVENOUS
  Administered 2020-05-13: 50 ug via INTRAVENOUS

## 2020-05-13 MED ORDER — FENTANYL CITRATE (PF) 100 MCG/2ML IJ SOLN
INTRAMUSCULAR | Status: AC
  Filled 2020-05-13: qty 2

## 2020-05-13 MED ORDER — ONDANSETRON HCL 4 MG/2ML IJ SOLN
INTRAMUSCULAR | Status: DC | PRN
  Administered 2020-05-13: 4 mg via INTRAVENOUS

## 2020-05-13 MED ORDER — OXYCODONE HCL 5 MG/5ML PO SOLN: 5.0000 mg | Freq: Once | ORAL | Status: AC | PRN

## 2020-05-13 MED ORDER — CHLORHEXIDINE GLUCONATE CLOTH 2 % EX PADS: 6.0000 | MEDICATED_PAD | Freq: Once | CUTANEOUS | Status: DC

## 2020-05-13 MED ORDER — BUPIVACAINE-EPINEPHRINE (PF) 0.25% -1:200000 IJ SOLN
INTRAMUSCULAR | Status: AC
  Filled 2020-05-13: qty 30

## 2020-05-13 MED ORDER — IBUPROFEN 800 MG PO TABS: 800.0000 mg | ORAL_TABLET | Freq: Three times a day (TID) | ORAL | 0 refills | Status: AC | PRN

## 2020-05-13 MED ORDER — DEXAMETHASONE SODIUM PHOSPHATE 10 MG/ML IJ SOLN
INTRAMUSCULAR | Status: AC
  Filled 2020-05-13: qty 1

## 2020-05-13 MED ORDER — CHLORHEXIDINE GLUCONATE 0.12 % MT SOLN
15.0000 mL | Freq: Once | OROMUCOSAL | Status: AC
  Administered 2020-05-13: 15 mL via OROMUCOSAL

## 2020-05-13 MED ORDER — MIDAZOLAM HCL 2 MG/2ML IJ SOLN
INTRAMUSCULAR | Status: AC
  Filled 2020-05-13: qty 2

## 2020-05-13 MED ORDER — CEFAZOLIN IN SODIUM CHLORIDE 3-0.9 GM/100ML-% IV SOLN
3.0000 g | INTRAVENOUS | Status: AC
  Administered 2020-05-13: 3 g via INTRAVENOUS
  Filled 2020-05-13: qty 100

## 2020-05-13 MED ORDER — OXYCODONE HCL 5 MG PO TABS
5.0000 mg | ORAL_TABLET | Freq: Once | ORAL | Status: AC | PRN
  Administered 2020-05-13: 5 mg via ORAL

## 2020-05-13 MED ORDER — OXYCODONE HCL 5 MG PO TABS: 5.0000 mg | ORAL_TABLET | Freq: Four times a day (QID) | ORAL | 0 refills | Status: AC | PRN

## 2020-05-13 MED ORDER — ROCURONIUM BROMIDE 100 MG/10ML IV SOLN
INTRAVENOUS | Status: DC | PRN
  Administered 2020-05-13: 100 mg via INTRAVENOUS

## 2020-05-13 MED ORDER — DEXAMETHASONE SODIUM PHOSPHATE 4 MG/ML IJ SOLN
INTRAMUSCULAR | Status: DC | PRN
  Administered 2020-05-13: 10 mg via INTRAVENOUS

## 2020-05-13 MED ORDER — CELECOXIB 200 MG PO CAPS
400.0000 mg | ORAL_CAPSULE | ORAL | Status: AC
  Administered 2020-05-13: 400 mg via ORAL
  Filled 2020-05-13: qty 2

## 2020-05-13 MED ORDER — KETOROLAC TROMETHAMINE 30 MG/ML IJ SOLN
30.0000 mg | Freq: Once | INTRAMUSCULAR | Status: AC | PRN
  Administered 2020-05-13: 30 mg via INTRAVENOUS

## 2020-05-13 MED ORDER — 0.9 % SODIUM CHLORIDE (POUR BTL) OPTIME
TOPICAL | Status: DC | PRN
  Administered 2020-05-13: 1000 mL

## 2020-05-13 MED ORDER — MEPERIDINE HCL 50 MG/ML IJ SOLN: 6.2500 mg | INTRAMUSCULAR | Status: DC | PRN

## 2020-05-13 MED ORDER — ESMOLOL HCL 100 MG/10ML IV SOLN
INTRAVENOUS | Status: DC | PRN
  Administered 2020-05-13: 20 mg via INTRAVENOUS
  Administered 2020-05-13: 30 mg via INTRAVENOUS

## 2020-05-13 MED ORDER — SUGAMMADEX SODIUM 500 MG/5ML IV SOLN
INTRAVENOUS | Status: DC | PRN
  Administered 2020-05-13: 300 mg via INTRAVENOUS

## 2020-05-13 MED ORDER — MIDAZOLAM HCL 5 MG/5ML IJ SOLN
INTRAMUSCULAR | Status: DC | PRN
  Administered 2020-05-13 (×2): 1 mg via INTRAVENOUS

## 2020-05-13 MED ORDER — HYDROMORPHONE HCL 1 MG/ML IJ SOLN
0.2500 mg | INTRAMUSCULAR | Status: DC | PRN
  Administered 2020-05-13 (×4): 0.5 mg via INTRAVENOUS

## 2020-05-13 MED ORDER — PROMETHAZINE HCL 25 MG/ML IJ SOLN
6.2500 mg | INTRAMUSCULAR | Status: DC | PRN
  Administered 2020-05-13: 6.25 mg via INTRAVENOUS

## 2020-05-13 MED ORDER — PROMETHAZINE HCL 25 MG/ML IJ SOLN
INTRAMUSCULAR | Status: AC
  Filled 2020-05-13: qty 1

## 2020-05-13 MED ORDER — LIDOCAINE 2% (20 MG/ML) 5 ML SYRINGE
INTRAMUSCULAR | Status: DC | PRN
  Administered 2020-05-13: 60 mg via INTRAVENOUS

## 2020-05-13 MED ORDER — KETOROLAC TROMETHAMINE 30 MG/ML IJ SOLN
INTRAMUSCULAR | Status: AC
  Filled 2020-05-13: qty 1

## 2020-05-13 MED ORDER — PROPOFOL 10 MG/ML IV BOLUS
INTRAVENOUS | Status: DC | PRN
  Administered 2020-05-13: 30 mg via INTRAVENOUS
  Administered 2020-05-13: 200 mg via INTRAVENOUS

## 2020-05-13 MED ORDER — ROCURONIUM BROMIDE 10 MG/ML (PF) SYRINGE
PREFILLED_SYRINGE | INTRAVENOUS | Status: AC
  Filled 2020-05-13: qty 10

## 2020-05-13 MED ORDER — ACETAMINOPHEN 500 MG PO TABS
1000.0000 mg | ORAL_TABLET | ORAL | Status: AC
  Administered 2020-05-13: 1000 mg via ORAL
  Filled 2020-05-13: qty 2

## 2020-05-13 MED ORDER — ONDANSETRON HCL 4 MG/2ML IJ SOLN
INTRAMUSCULAR | Status: AC
  Filled 2020-05-13: qty 2

## 2020-05-13 MED ORDER — LACTATED RINGERS IV SOLN: INTRAVENOUS | Status: DC

## 2020-05-13 MED ORDER — LIDOCAINE 2% (20 MG/ML) 5 ML SYRINGE
INTRAMUSCULAR | Status: AC
  Filled 2020-05-13: qty 5

## 2020-05-13 MED ORDER — OXYCODONE HCL 5 MG PO TABS
ORAL_TABLET | ORAL | Status: AC
  Filled 2020-05-13: qty 1

## 2020-05-13 SURGICAL SUPPLY — 48 items
APPLIER CLIP 5 13 M/L LIGAMAX5 (MISCELLANEOUS)
BENZOIN TINCTURE PRP APPL 2/3 (GAUZE/BANDAGES/DRESSINGS) ×2 IMPLANT
BINDER ABDOMINAL 12 ML 46-62 (SOFTGOODS) ×2 IMPLANT
BNDG ADH 1X3 SHEER STRL LF (GAUZE/BANDAGES/DRESSINGS) ×10 IMPLANT
BNDG COHESIVE 4X5 WHT NS (GAUZE/BANDAGES/DRESSINGS) ×2 IMPLANT
CHLORAPREP W/TINT 26 (MISCELLANEOUS) ×2 IMPLANT
CLIP APPLIE 5 13 M/L LIGAMAX5 (MISCELLANEOUS) IMPLANT
CLSR STERI-STRIP ANTIMIC 1/2X4 (GAUZE/BANDAGES/DRESSINGS) ×2 IMPLANT
COVER SURGICAL LIGHT HANDLE (MISCELLANEOUS) ×2 IMPLANT
COVER WAND RF STERILE (DRAPES) IMPLANT
DECANTER SPIKE VIAL GLASS SM (MISCELLANEOUS) ×2 IMPLANT
DERMABOND ADVANCED (GAUZE/BANDAGES/DRESSINGS) ×1
DERMABOND ADVANCED .7 DNX12 (GAUZE/BANDAGES/DRESSINGS) ×1 IMPLANT
DEVICE SECURE STRAP 25 ABSORB (INSTRUMENTS) IMPLANT
DRAIN CHANNEL 19F RND (DRAIN) IMPLANT
ELECT REM PT RETURN 15FT ADLT (MISCELLANEOUS) ×2 IMPLANT
EVACUATOR SILICONE 100CC (DRAIN) IMPLANT
GLOVE SURG POLYISO LF SZ7 (GLOVE) ×2 IMPLANT
GLOVE SURG UNDER POLY LF SZ7 (GLOVE) ×2 IMPLANT
GOWN STRL REUS W/TWL LRG LVL3 (GOWN DISPOSABLE) ×2 IMPLANT
GOWN STRL REUS W/TWL XL LVL3 (GOWN DISPOSABLE) ×4 IMPLANT
GRASPER SUT TROCAR 14GX15 (MISCELLANEOUS) ×2 IMPLANT
IRRIG SUCT STRYKERFLOW 2 WTIP (MISCELLANEOUS)
IRRIGATION SUCT STRKRFLW 2 WTP (MISCELLANEOUS) IMPLANT
KIT BASIN OR (CUSTOM PROCEDURE TRAY) ×2 IMPLANT
KIT TURNOVER KIT A (KITS) ×2 IMPLANT
MARKER SKIN DUAL TIP RULER LAB (MISCELLANEOUS) IMPLANT
MESH VENTRALIGHT ST 6IN CRC (Mesh General) ×2 IMPLANT
PENCIL SMOKE EVACUATOR (MISCELLANEOUS) IMPLANT
PROTECTOR NERVE ULNAR (MISCELLANEOUS) ×4 IMPLANT
SCISSORS LAP 5X35 DISP (ENDOMECHANICALS) ×2 IMPLANT
SET TUBE SMOKE EVAC HIGH FLOW (TUBING) ×2 IMPLANT
SHEARS HARMONIC ACE PLUS 36CM (ENDOMECHANICALS) IMPLANT
SLEEVE XCEL OPT CAN 5 100 (ENDOMECHANICALS) ×2 IMPLANT
STRIP CLOSURE SKIN 1/2X4 (GAUZE/BANDAGES/DRESSINGS) ×2 IMPLANT
SUT ETHILON 2 0 PS N (SUTURE) IMPLANT
SUT MNCRL AB 4-0 PS2 18 (SUTURE) ×2 IMPLANT
SUT NOVA 0 T19/GS 22DT (SUTURE) ×4 IMPLANT
SUT PDS AB 0 CT1 36 (SUTURE) ×2 IMPLANT
SUT SILK 3 0 (SUTURE) ×1
SUT SILK 3-0 18XBRD TIE 12 (SUTURE) ×1 IMPLANT
SUT VICRYL 0 UR6 27IN ABS (SUTURE) IMPLANT
TAPE CLOTH 4X10 WHT NS (GAUZE/BANDAGES/DRESSINGS) IMPLANT
TOWEL OR 17X26 10 PK STRL BLUE (TOWEL DISPOSABLE) ×2 IMPLANT
TRAY LAPAROSCOPIC (CUSTOM PROCEDURE TRAY) ×2 IMPLANT
TROCAR BLADELESS OPT 5 100 (ENDOMECHANICALS) ×2 IMPLANT
TROCAR XCEL 12X100 BLDLESS (ENDOMECHANICALS) IMPLANT
TROCAR XCEL NON-BLD 11X100MML (ENDOMECHANICALS) ×2 IMPLANT

## 2020-05-13 NOTE — Transfer of Care (Signed)
Immediate Anesthesia Transfer of Care Note  Patient: Chad Valencia  Procedure(s) Performed: LAPAROSCOPIC UMBILICAL HERNIA REPAIR WITH MESH (N/A )  Patient Location: PACU  Anesthesia Type:General  Level of Consciousness: awake  Airway & Oxygen Therapy: Patient Spontanous Breathing and Patient connected to face mask oxygen  Post-op Assessment: Report given to RN and Post -op Vital signs reviewed and stable  Post vital signs: Reviewed and stable  Last Vitals:  Vitals Value Taken Time  BP 158/95 05/13/20 0906  Temp    Pulse 99 05/13/20 0908  Resp 15 05/13/20 0908  SpO2 100 % 05/13/20 0908  Vitals shown include unvalidated device data.  Last Pain:  Vitals:   05/13/20 0609  TempSrc: Oral  PainSc:          Complications: No complications documented.

## 2020-05-13 NOTE — Anesthesia Procedure Notes (Signed)
Procedure Name: Intubation Date/Time: 05/13/2020 7:31 AM Performed by: Caren Macadam, CRNA Pre-anesthesia Checklist: Patient identified, Emergency Drugs available, Suction available and Patient being monitored Patient Re-evaluated:Patient Re-evaluated prior to induction Oxygen Delivery Method: Circle system utilized Preoxygenation: Pre-oxygenation with 100% oxygen Induction Type: IV induction Ventilation: Mask ventilation without difficulty Laryngoscope Size: Miller and 2 Grade View: Grade I Tube type: Oral Tube size: 7.5 mm Number of attempts: 1 Airway Equipment and Method: Stylet Placement Confirmation: ETT inserted through vocal cords under direct vision,  positive ETCO2 and breath sounds checked- equal and bilateral Secured at: 22 cm Tube secured with: Tape Dental Injury: Teeth and Oropharynx as per pre-operative assessment

## 2020-05-13 NOTE — Anesthesia Preprocedure Evaluation (Addendum)
Anesthesia Evaluation  Patient identified by MRN, date of birth, ID band Patient awake    Reviewed: Allergy & Precautions, NPO status , Patient's Chart, lab work & pertinent test results  Airway Mallampati: III  TM Distance: >3 FB Neck ROM: Full    Dental no notable dental hx. (+) Teeth Intact, Dental Advisory Given, Missing,    Pulmonary former smoker,  Quit smoking 2020, light smoker    Pulmonary exam normal breath sounds clear to auscultation       Cardiovascular hypertension, Pt. on medications Normal cardiovascular exam Rhythm:Regular Rate:Normal     Neuro/Psych negative neurological ROS  negative psych ROS   GI/Hepatic negative GI ROS, Neg liver ROS,   Endo/Other  diabetes, Well Controlled, Type 2, Oral Hypoglycemic AgentsMorbid obesityBMI 44 Last a1c 6.8  Renal/GU negative Renal ROS  negative genitourinary   Musculoskeletal negative musculoskeletal ROS (+)   Abdominal (+) + obese,   Peds negative pediatric ROS (+)  Hematology negative hematology ROS (+)   Anesthesia Other Findings Umbilical hernia   Reproductive/Obstetrics negative OB ROS                            Anesthesia Physical Anesthesia Plan  ASA: III  Anesthesia Plan: General   Post-op Pain Management:    Induction: Intravenous  PONV Risk Score and Plan: 3 and Ondansetron, Dexamethasone, Midazolam and Treatment may vary due to age or medical condition  Airway Management Planned: Oral ETT  Additional Equipment: None  Intra-op Plan:   Post-operative Plan: Extubation in OR  Informed Consent: I have reviewed the patients History and Physical, chart, labs and discussed the procedure including the risks, benefits and alternatives for the proposed anesthesia with the patient or authorized representative who has indicated his/her understanding and acceptance.     Dental advisory given  Plan Discussed with:  CRNA  Anesthesia Plan Comments:         Anesthesia Quick Evaluation

## 2020-05-13 NOTE — Discharge Instructions (Signed)
[facility] _______[facility] ° °UMBILICAL OR INGUINAL HERNIA REPAIR: POST OP INSTRUCTIONS ° °Always review your discharge instruction sheet given to you by the facility where your surgery was performed. °IF YOU HAVE DISABILITY OR FAMILY LEAVE FORMS, YOU MUST BRING THEM TO THE OFFICE FOR PROCESSING.   °DO NOT GIVE THEM TO YOUR DOCTOR. ° °1. A  prescription for pain medication may be given to you upon discharge.  Take your pain medication as prescribed, if needed.  If narcotic pain medicine is not needed, then you may take acetaminophen (Tylenol) or ibuprofen (Advil) as needed. °2. Take your usually prescribed medications unless otherwise directed. °If you need a refill on your pain medication, please contact your pharmacy.  They will contact our office to request authorization. Prescriptions will not be filled after 5 pm or on week-ends. °3. You should follow a light diet the first 24 hours after arrival home, such as soup and crackers, etc.  Be sure to include lots of fluids daily.  Resume your normal diet the day after surgery. °4.Most patients will experience some swelling and bruising around the umbilicus or in the groin and scrotum.  Ice packs and reclining will help.  Swelling and bruising can take several days to resolve.  °6. It is common to experience some constipation if taking pain medication after surgery.  Increasing fluid intake and taking a stool softener (such as Colace) will usually help or prevent this problem from occurring.  A mild laxative (Milk of Magnesia or Miralax) should be taken according to package directions if there are no bowel movements after 48 hours. °7. Unless discharge instructions indicate otherwise, you may remove your bandages 24-48 hours after surgery, and you may shower at that time.  You may have steri-strips (small skin tapes) in place directly over the incision.  These strips should be left on the skin for 7-10 days.  If your surgeon used skin glue on the  incision, you may shower in 24 hours.  The glue will flake off over the next 2-3 weeks.  Any sutures or staples will be removed at the office during your follow-up visit. °8. ACTIVITIES:  You may resume regular (light) daily activities beginning the next day--such as daily self-care, walking, climbing stairs--gradually increasing activities as tolerated.  You may have sexual intercourse when it is comfortable.  Refrain from any heavy lifting or straining until approved by your doctor. ° °a.You may drive when you are no longer taking prescription pain medication, you can comfortably wear a seatbelt, and you can safely maneuver your car and apply brakes. °b.RETURN TO WORK:   °_____________________________________________ ° °9.You should see your doctor in the office for a follow-up appointment approximately 2-3 weeks after your surgery.  Make sure that you call for this appointment within a day or two after you arrive home to insure a convenient appointment time. °10.OTHER INSTRUCTIONS: _________________________ °   _____________________________________ ° °WHEN TO CALL YOUR DOCTOR: °1. Fever over 101.0 °2. Inability to urinate °3. Nausea and/or vomiting °4. Extreme swelling or bruising °5. Continued bleeding from incision. °6. Increased pain, redness, or drainage from the incision ° °The clinic staff is available to answer your questions during regular business hours.  Please don’t hesitate to call and ask to speak to one of the nurses for clinical concerns.  If you have a medical emergency, go to the nearest emergency room or call 911.  A surgeon from [facility] is always on call at the hospital ° ° °  [address], [address], [place], [place]  [address] ? ° [address] [address], [place], [place]   [address] °[phone] ? [phone] ? FAX [phone] °Web site: [email or web address] °

## 2020-05-13 NOTE — H&P (Signed)
Chad Valencia is an 45 y.o. male.   Chief Complaint: umbilical hernia HPI: 45 yo male with long history of umbilical hernia that has become more painful.  Past Medical History:  Diagnosis Date  . Hypertension   . Pre-diabetes     Past Surgical History:  Procedure Laterality Date  . NO PAST SURGERIES      History reviewed. No pertinent family history. Social History:  reports that he quit smoking about 2 years ago. His smoking use included cigarettes. He has a 2.50 pack-year smoking history. He has never used smokeless tobacco. He reports that he does not drink alcohol and does not use drugs.  Allergies: No Known Allergies  Medications Prior to Admission  Medication Sig Dispense Refill  . atorvastatin (LIPITOR) 40 MG tablet Take 40 mg by mouth daily.    . ergocalciferol (VITAMIN D2) 1.25 MG (50000 UT) capsule Take 50,000 Units by mouth once a week.    . losartan (COZAAR) 25 MG tablet Take 25 mg by mouth daily.    . metFORMIN (GLUCOPHAGE-XR) 750 MG 24 hr tablet Take 750 mg by mouth daily with breakfast.      Results for orders placed or performed during the hospital encounter of 05/13/20 (from the past 48 hour(s))  Glucose, capillary     Status: Abnormal   Collection Time: 05/13/20  5:50 AM  Result Value Ref Range   Glucose-Capillary 154 (H) 70 - 99 mg/dL    Comment: Glucose reference range applies only to samples taken after fasting for at least 8 hours.   No results found.  Review of Systems  Constitutional: Negative for chills and fever.  HENT: Negative for hearing loss.   Respiratory: Negative for cough.   Cardiovascular: Negative for chest pain and palpitations.  Gastrointestinal: Negative for abdominal pain, nausea and vomiting.  Genitourinary: Negative for dysuria and urgency.  Musculoskeletal: Negative for myalgias and neck pain.  Skin: Negative for rash.  Neurological: Negative for dizziness and headaches.  Hematological: Does not bruise/bleed easily.   Psychiatric/Behavioral: Negative for suicidal ideas.    Blood pressure 113/84, pulse 67, temperature 97.9 F (36.6 C), temperature source Oral, resp. rate 16, height 5\' 11"  (1.803 m), weight (!) 143.3 kg, SpO2 97 %. Physical Exam Vitals reviewed.  Constitutional:      Appearance: He is well-developed.  HENT:     Head: Normocephalic and atraumatic.  Eyes:     Conjunctiva/sclera: Conjunctivae normal.     Pupils: Pupils are equal, round, and reactive to light.  Cardiovascular:     Rate and Rhythm: Normal rate and regular rhythm.  Pulmonary:     Effort: Pulmonary effort is normal.     Breath sounds: Normal breath sounds.  Abdominal:     General: Bowel sounds are normal. There is no distension.     Palpations: Abdomen is soft.     Tenderness: There is no abdominal tenderness.     Comments: Umbilical hernia  Musculoskeletal:        General: Normal range of motion.     Cervical back: Normal range of motion and neck supple.  Skin:    General: Skin is warm and dry.  Neurological:     Mental Status: He is alert and oriented to person, place, and time.  Psychiatric:        Behavior: Behavior normal.    Assessment/Plan 45 yo male with umbilical hernia -lap umbilical hernia -ERAS protocol  59, MD 05/13/2020, 7:18 AM

## 2020-05-13 NOTE — Anesthesia Postprocedure Evaluation (Signed)
Anesthesia Post Note  Patient: Chad Valencia  Procedure(s) Performed: LAPAROSCOPIC UMBILICAL HERNIA REPAIR WITH MESH (N/A )     Patient location during evaluation: PACU Anesthesia Type: General Level of consciousness: awake and alert, oriented and patient cooperative Pain management: pain level controlled Vital Signs Assessment: post-procedure vital signs reviewed and stable Respiratory status: spontaneous breathing, nonlabored ventilation and respiratory function stable Cardiovascular status: blood pressure returned to baseline and stable Postop Assessment: no apparent nausea or vomiting Anesthetic complications: no   No complications documented.  Last Vitals:  Vitals:   05/13/20 0906 05/13/20 0915  BP: (!) 158/95 (!) 157/64  Pulse: 87 89  Resp: 16 19  Temp: 36.5 C   SpO2: 99% 99%    Last Pain:  Vitals:   05/13/20 0915  TempSrc:   PainSc: 5                  Lannie Fields

## 2020-05-13 NOTE — Op Note (Signed)
Preoperative diagnosis: umbilical hernia without obstruction or gangrene  Postoperative diagnosis: Same   Procedure: laparoscopic umbilical hernia repair with placement of underlay mesh  Surgeon: Feliciana Rossetti, M.D.  Asst: none  Anesthesia: Gen.   Indications for procedure: Chad Valencia is a 45 y.o. male with symptoms of abdominal pain and hernia incarcerated on exam.   Description of procedure: The patient was brought into the operative suite, placed supine. Anesthesia was administered with endotracheal tube. Patient was strapped in place and foot board was secured. Both arms were tucked. All pressure points were offloaded by foam padding. The patient was prepped and draped in the usual sterile fashion.  A small incision was made over the left subcostal area and 22mm trocar was placed with optical entry. Pneumoperitoneum was applied with high flow low pressure. The abdominal cavity was inspected and omentum was seen going into the umbilical herna. 1 37mm trocar was placed in the mid left abdomen and 1 additional 52mm trocar was placed in the LLQ.  Bilateral TAP blocks were placed with Marcaine.  Blunt dissection was used to remove most of the filmy adhesions with occasional sharp dissection. Cautery was used to provide hemostasis. The hernia was identified and was filled with omentum. It was slowly dissected free and reduced. The defect was about 4 cm in diameter. The defect was repaired with interrupted 0 PDS. A 15 cm round ventralight mesh was inserted and placed beneath the fascia and peritoneum to the repair the mesh. 8 transfascial 0 novafil sutures were used to secure the mesh in place and absorbable tackers were used to appose the mesh against the abdominal wall in all areas.  The abdominal contents were again inspected and hemostasis was intact.  lk suture fascia:[id number]} was used to close the fascial defect of the 23mm trocar site using suture passer. Pneumoperitoneum was  removed, all trocar were removed. All incisions were closed with 4-0 monocryl subcuticular stitch. The patient woke from anesthesia and was brought to PACU in stable condition.  Findings: 4 cm umbilical hernia with incarcerated omentum  Specimen: none  Blood loss: 20 ml  Local anesthesia: 30 ml marcaine  Complications: none  Implant: 15 cm round ventralight ST mesh  Feliciana Rossetti, M.D. General, Bariatric, & Minimally Invasive Surgery [facility]

## 2020-05-14 ENCOUNTER — Encounter (HOSPITAL_COMMUNITY): Payer: Self-pay | Admitting: General Surgery
